# Patient Record
Sex: Female | Born: 1995 | Race: White | Hispanic: No | Marital: Single | Smoking: Never smoker
Health system: Southern US, Community
[De-identification: ages and names within clinical notes are randomized; demographics above are authoritative.]

## PROBLEM LIST (undated history)

## (undated) DIAGNOSIS — F32A Depression, unspecified: Secondary | ICD-10-CM

## (undated) DIAGNOSIS — F329 Major depressive disorder, single episode, unspecified: Secondary | ICD-10-CM

## (undated) DIAGNOSIS — I7300 Raynaud's syndrome without gangrene: Secondary | ICD-10-CM

## (undated) DIAGNOSIS — F988 Other specified behavioral and emotional disorders with onset usually occurring in childhood and adolescence: Secondary | ICD-10-CM

## (undated) HISTORY — DX: Depression, unspecified: F32.A

## (undated) HISTORY — DX: Major depressive disorder, single episode, unspecified: F32.9

## (undated) HISTORY — PX: WISDOM TOOTH EXTRACTION: SHX21

## (undated) HISTORY — DX: Raynaud's syndrome without gangrene: I73.00

## (undated) HISTORY — DX: Other specified behavioral and emotional disorders with onset usually occurring in childhood and adolescence: F98.8

---
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
## 2016-02-11 DIAGNOSIS — N909 Noninflammatory disorder of vulva and perineum, unspecified: Secondary | ICD-10-CM | POA: Diagnosis not present

## 2016-02-11 DIAGNOSIS — N898 Other specified noninflammatory disorders of vagina: Secondary | ICD-10-CM | POA: Diagnosis not present

## 2016-02-26 DIAGNOSIS — J1089 Influenza due to other identified influenza virus with other manifestations: Secondary | ICD-10-CM | POA: Diagnosis not present

## 2016-03-22 DIAGNOSIS — N898 Other specified noninflammatory disorders of vagina: Secondary | ICD-10-CM | POA: Diagnosis not present

## 2016-03-22 DIAGNOSIS — R002 Palpitations: Secondary | ICD-10-CM | POA: Diagnosis not present

## 2016-03-22 DIAGNOSIS — Z1321 Encounter for screening for nutritional disorder: Secondary | ICD-10-CM | POA: Diagnosis not present

## 2016-04-11 ENCOUNTER — Encounter: Payer: Self-pay | Admitting: Family Medicine

## 2016-04-11 ENCOUNTER — Ambulatory Visit (INDEPENDENT_AMBULATORY_CARE_PROVIDER_SITE_OTHER): Payer: 59 | Admitting: Family Medicine

## 2016-04-11 VITALS — BP 92/70 | HR 70 | Temp 98.2°F | Ht 64.0 in | Wt 117.4 lb

## 2016-04-11 DIAGNOSIS — Z0000 Encounter for general adult medical examination without abnormal findings: Secondary | ICD-10-CM

## 2016-04-11 LAB — CBC
HCT: 44.2 % (ref 36.0–46.0)
Hemoglobin: 14.3 g/dL (ref 12.0–15.0)
MCHC: 32.4 g/dL (ref 30.0–36.0)
MCV: 86.3 fl (ref 78.0–100.0)
Platelets: 266 K/uL (ref 150.0–400.0)
RBC: 5.12 Mil/uL — ABNORMAL HIGH (ref 3.87–5.11)
RDW: 14.1 % (ref 11.5–15.5)
WBC: 6.5 K/uL (ref 4.0–10.5)

## 2016-04-11 LAB — LIPID PANEL
Cholesterol: 158 mg/dL (ref 0–200)
HDL: 61.6 mg/dL
LDL Cholesterol: 85 mg/dL (ref 0–99)
NonHDL: 96.36
Total CHOL/HDL Ratio: 3
Triglycerides: 56 mg/dL (ref 0.0–149.0)
VLDL: 11.2 mg/dL (ref 0.0–40.0)

## 2016-04-11 LAB — COMPREHENSIVE METABOLIC PANEL WITH GFR
ALT: 27 U/L (ref 0–35)
AST: 26 U/L (ref 0–37)
Albumin: 4.7 g/dL (ref 3.5–5.2)
Alkaline Phosphatase: 53 U/L (ref 39–117)
BUN: 5 mg/dL — ABNORMAL LOW (ref 6–23)
CO2: 26 meq/L (ref 19–32)
Calcium: 10.2 mg/dL (ref 8.4–10.5)
Chloride: 105 meq/L (ref 96–112)
Creatinine, Ser: 0.6 mg/dL (ref 0.40–1.20)
GFR: 131.84 mL/min
Glucose, Bld: 84 mg/dL (ref 70–99)
Potassium: 4.5 meq/L (ref 3.5–5.1)
Sodium: 138 meq/L (ref 135–145)
Total Bilirubin: 0.9 mg/dL (ref 0.2–1.2)
Total Protein: 7.4 g/dL (ref 6.0–8.3)

## 2016-04-11 LAB — TSH: TSH: 1.22 u[IU]/mL (ref 0.35–4.50)

## 2016-04-11 NOTE — Progress Notes (Signed)
   Subjective:  Patient ID: Crystal Ritter, female    DOB: 11-May-1995  Age: 21 y.o. MRN: [id number]  CC: Physical exam.  HPI Crystal Ritter is a 21 y.o. female presents to the clinic today for a physical exam.  Preventative Healthcare  Pap smear: Up to date.  Immunizations  Tetanus - Up to date. Unsure of date.  Flu - Declines.  Labs: Desires screening labs today.  Exercise: No regular exercise.  Alcohol use: 2 drinks/week.  Smoking/tobacco use: No.  STD/HIV testing: Had testing in Dec.  PMH, Surgical Hx, Family Hx, Social History reviewed and updated as below.  Past Medical History:  Diagnosis Date  . ADD (attention deficit disorder)   . Depression   . Raynaud phenomenon    Past Surgical History:  Procedure Laterality Date  . WISDOM TOOTH EXTRACTION     Family History  Problem Relation Age of Onset  . Cancer Mother     breast cancer  . Hypertension Mother   . Hyperlipidemia Father   . Hypertension Father   . Cancer Maternal Grandmother     breast cancer  . Diabetes Paternal Grandmother    Social History  Substance Use Topics  . Smoking status: Never Smoker  . Smokeless tobacco: Never Used  . Alcohol use Yes    Review of Systems  Musculoskeletal:       Raynauds.  All other systems reviewed and are negative.   Objective:   Today's Vitals: BP 92/70   Pulse 70   Temp 98.2 F (36.8 C) (Oral)   Ht 5\' 4"  (1.626 m)   Wt 117 lb 6.4 oz (53.3 kg)   LMP 03/22/2016   SpO2 99%   BMI 20.15 kg/m   Physical Exam  Constitutional: She is oriented to person, place, and time. She appears well-developed and well-nourished. No distress.  HENT:  Head: Normocephalic and atraumatic.  Nose: Nose normal.  Mouth/Throat: Oropharynx is clear and moist. No oropharyngeal exudate.  Normal TM's bilaterally.   Eyes: Conjunctivae are normal. No scleral icterus.  Neck: Neck supple.  Cardiovascular: Normal rate and regular rhythm.   No murmur heard. Pulmonary/Chest:  Effort normal and breath sounds normal. She has no wheezes. She has no rales.  Abdominal: Soft. She exhibits no distension. There is no tenderness. There is no rebound and no guarding.  Musculoskeletal: Normal range of motion. She exhibits no edema.  Lymphadenopathy:    She has no cervical adenopathy.  Neurological: She is alert and oriented to person, place, and time.  Skin: Skin is warm and dry. No rash noted.  Psychiatric: She has a normal mood and affect.  Vitals reviewed.   Assessment & Plan:   Problem List Items Addressed This Visit    Annual physical exam - Primary    Pap smear up to date. Declines flu vaccine. Tdap likely up to date (prior to entering college). Screening labs today. Had HIV and STD screening in Dec. 2017.      Relevant Orders   CBC   Comprehensive metabolic panel   Lipid panel   TSH      Outpatient Encounter Prescriptions as of 04/11/2016  Medication Sig  . 5-Hydroxytryptophan (5-HTP) 100 MG CAPS Take by mouth.  Marland Kitchen. amphetamine-dextroamphetamine (ADDERALL) 20 MG tablet Take 20 mg by mouth daily.   No facility-administered encounter medications on file as of 04/11/2016.     Follow-up: Annually  Everlene OtherJayce Letishia Elliott DO [facility] [facility]

## 2016-04-11 NOTE — Progress Notes (Signed)
Pre visit review using our clinic review tool, if applicable. No additional management support is needed unless otherwise documented below in the visit note. 

## 2016-04-11 NOTE — Patient Instructions (Signed)
Try arginine.  Take care  Dr. Lacinda Axon   Health Maintenance, Female Adopting a healthy lifestyle and getting preventive care can go a long way to promote health and wellness. Talk with your health care provider about what schedule of regular examinations is right for you. This is a good chance for you to check in with your provider about disease prevention and staying healthy. In between checkups, there are plenty of things you can do on your own. Experts have done a lot of research about which lifestyle changes and preventive measures are most likely to keep you healthy. Ask your health care provider for more information. Weight and diet Eat a healthy diet  Be sure to include plenty of vegetables, fruits, low-fat dairy products, and lean protein.  Do not eat a lot of foods high in solid fats, added sugars, or salt.  Get regular exercise. This is one of the most important things you can do for your health.  Most adults should exercise for at least 150 minutes each week. The exercise should increase your heart rate and make you sweat (moderate-intensity exercise).  Most adults should also do strengthening exercises at least twice a week. This is in addition to the moderate-intensity exercise. Maintain a healthy weight  Body mass index (BMI) is a measurement that can be used to identify possible weight problems. It estimates body fat based on height and weight. Your health care provider can help determine your BMI and help you achieve or maintain a healthy weight.  For females 23 years of age and older:  A BMI below 18.5 is considered underweight.  A BMI of 18.5 to 24.9 is normal.  A BMI of 25 to 29.9 is considered overweight.  A BMI of 30 and above is considered obese. Watch levels of cholesterol and blood lipids  You should start having your blood tested for lipids and cholesterol at 23 years of age, then have this test every 5 years.  You may need to have your cholesterol levels  checked more often if:  Your lipid or cholesterol levels are high.  You are older than 23 years of age.  You are at high risk for heart disease. Cancer screening Lung Cancer  Lung cancer screening is recommended for adults 29-40 years old who are at high risk for lung cancer because of a history of smoking.  A yearly low-dose CT scan of the lungs is recommended for people who:  Currently smoke.  Have quit within the past 15 years.  Have at least a 30-pack-year history of smoking. A pack year is smoking an average of one pack of cigarettes a day for 1 year.  Yearly screening should continue until it has been 15 years since you quit.  Yearly screening should stop if you develop a health problem that would prevent you from having lung cancer treatment. Breast Cancer  Practice breast self-awareness. This means understanding how your breasts normally appear and feel.  It also means doing regular breast self-exams. Let your health care provider know about any changes, no matter how small.  If you are in your 20s or 30s, you should have a clinical breast exam (CBE) by a health care provider every 1-3 years as part of a regular health exam.  If you are 70 or older, have a CBE every year. Also consider having a breast X-ray (mammogram) every year.  If you have a family history of breast cancer, talk to your health care provider about genetic screening.  If you are at high risk for breast cancer, talk to your health care provider about having an MRI and a mammogram every year.  Breast cancer gene (BRCA) assessment is recommended for women who have family members with BRCA-related cancers. BRCA-related cancers include:  Breast.  Ovarian.  Tubal.  Peritoneal cancers.  Results of the assessment will determine the need for genetic counseling and BRCA1 and BRCA2 testing. Cervical Cancer  Your health care provider may recommend that you be screened regularly for cancer of the pelvic  organs (ovaries, uterus, and vagina). This screening involves a pelvic examination, including checking for microscopic changes to the surface of your cervix (Pap test). You may be encouraged to have this screening done every 3 years, beginning at age 65.  For women ages 23-65, health care providers may recommend pelvic exams and Pap testing every 3 years, or they may recommend the Pap and pelvic exam, combined with testing for human papilloma virus (HPV), every 5 years. Some types of HPV increase your risk of cervical cancer. Testing for HPV may also be done on women of any age with unclear Pap test results.  Other health care providers may not recommend any screening for nonpregnant women who are considered low risk for pelvic cancer and who do not have symptoms. Ask your health care provider if a screening pelvic exam is right for you.  If you have had past treatment for cervical cancer or a condition that could lead to cancer, you need Pap tests and screening for cancer for at least 20 years after your treatment. If Pap tests have been discontinued, your risk factors (such as having a new sexual partner) need to be reassessed to determine if screening should resume. Some women have medical problems that increase the chance of getting cervical cancer. In these cases, your health care provider may recommend more frequent screening and Pap tests. Colorectal Cancer  This type of cancer can be detected and often prevented.  Routine colorectal cancer screening usually begins at 23 years of age and continues through 23 years of age.  Your health care provider may recommend screening at an earlier age if you have risk factors for colon cancer.  Your health care provider may also recommend using home test kits to check for hidden blood in the stool.  A small camera at the end of a tube can be used to examine your colon directly (sigmoidoscopy or colonoscopy). This is done to check for the earliest forms  of colorectal cancer.  Routine screening usually begins at age 43.  Direct examination of the colon should be repeated every 5-10 years through 23 years of age. However, you may need to be screened more often if early forms of precancerous polyps or small growths are found. Skin Cancer  Check your skin from head to toe regularly.  Tell your health care provider about any new moles or changes in moles, especially if there is a change in a mole's shape or color.  Also tell your health care provider if you have a mole that is larger than the size of a pencil eraser.  Always use sunscreen. Apply sunscreen liberally and repeatedly throughout the day.  Protect yourself by wearing long sleeves, pants, a wide-brimmed hat, and sunglasses whenever you are outside. Heart disease, diabetes, and high blood pressure  High blood pressure causes heart disease and increases the risk of stroke. High blood pressure is more likely to develop in:  People who have blood pressure in the  high end of the normal range (130-139/85-89 mm Hg).  People who are overweight or obese.  People who are African American.  If you are 29-58 years of age, have your blood pressure checked every 3-5 years. If you are 1 years of age or older, have your blood pressure checked every year. You should have your blood pressure measured twice-once when you are at a hospital or clinic, and once when you are not at a hospital or clinic. Record the average of the two measurements. To check your blood pressure when you are not at a hospital or clinic, you can use:  An automated blood pressure machine at a pharmacy.  A home blood pressure monitor.  If you are between 46 years and 51 years old, ask your health care provider if you should take aspirin to prevent strokes.  Have regular diabetes screenings. This involves taking a blood sample to check your fasting blood sugar level.  If you are at a normal weight and have a low risk for  diabetes, have this test once every three years after 23 years of age.  If you are overweight and have a high risk for diabetes, consider being tested at a younger age or more often. Preventing infection Hepatitis B  If you have a higher risk for hepatitis B, you should be screened for this virus. You are considered at high risk for hepatitis B if:  You were born in a country where hepatitis B is common. Ask your health care provider which countries are considered high risk.  Your parents were born in a high-risk country, and you have not been immunized against hepatitis B (hepatitis B vaccine).  You have HIV or AIDS.  You use needles to inject street drugs.  You live with someone who has hepatitis B.  You have had sex with someone who has hepatitis B.  You get hemodialysis treatment.  You take certain medicines for conditions, including cancer, organ transplantation, and autoimmune conditions. Hepatitis C  Blood testing is recommended for:  Everyone born from [birth year] through 1967.  Anyone with known risk factors for hepatitis C. Sexually transmitted infections (STIs)  You should be screened for sexually transmitted infections (STIs) including gonorrhea and chlamydia if:  You are sexually active and are younger than 22 years of age.  You are older than 23 years of age and your health care provider tells you that you are at risk for this type of infection.  Your sexual activity has changed since you were last screened and you are at an increased risk for chlamydia or gonorrhea. Ask your health care provider if you are at risk.  If you do not have HIV, but are at risk, it may be recommended that you take a prescription medicine daily to prevent HIV infection. This is called pre-exposure prophylaxis (PrEP). You are considered at risk if:  You are sexually active and do not regularly use condoms or know the HIV status of your partner(s).  You take drugs by injection.  You are  sexually active with a partner who has HIV. Talk with your health care provider about whether you are at high risk of being infected with HIV. If you choose to begin PrEP, you should first be tested for HIV. You should then be tested every 3 months for as long as you are taking PrEP. Pregnancy  If you are premenopausal and you may become pregnant, ask your health care provider about preconception counseling.  If you may become  pregnant, take 400 to 800 micrograms (mcg) of folic acid every day.  If you want to prevent pregnancy, talk to your health care provider about birth control (contraception). Osteoporosis and menopause  Osteoporosis is a disease in which the bones lose minerals and strength with aging. This can result in serious bone fractures. Your risk for osteoporosis can be identified using a bone density scan.  If you are 27 years of age or older, or if you are at risk for osteoporosis and fractures, ask your health care provider if you should be screened.  Ask your health care provider whether you should take a calcium or vitamin D supplement to lower your risk for osteoporosis.  Menopause may have certain physical symptoms and risks.  Hormone replacement therapy may reduce some of these symptoms and risks. Talk to your health care provider about whether hormone replacement therapy is right for you. Follow these instructions at home:  Schedule regular health, dental, and eye exams.  Stay current with your immunizations.  Do not use any tobacco products including cigarettes, chewing tobacco, or electronic cigarettes.  If you are pregnant, do not drink alcohol.  If you are breastfeeding, limit how much and how often you drink alcohol.  Limit alcohol intake to no more than 1 drink per day for nonpregnant women. One drink equals 12 ounces of beer, 5 ounces of wine, or 1 ounces of hard liquor.  Do not use street drugs.  Do not share needles.  Ask your health care  provider for help if you need support or information about quitting drugs.  Tell your health care provider if you often feel depressed.  Tell your health care provider if you have ever been abused or do not feel safe at home. This information is not intended to replace advice given to you by your health care provider. Make sure you discuss any questions you have with your health care provider. Document Released: 07/25/2010 Document Revised: 06/17/2015 Document Reviewed: 10/13/2014 Elsevier Interactive Patient Education  2017 [place] Raynaud phenomenon is a condition that affects the blood vessels (arteries) that carry blood to your fingers and toes. The arteries that supply blood to your ears or the tip of your nose might also be affected. Raynaud phenomenon causes the arteries to temporarily narrow. As a result, the flow of blood to the affected areas is temporarily decreased. This usually occurs in response to cold temperatures or stress. During an attack, the skin in the affected areas turns white. You may also feel tingling or numbness in those areas. Attacks usually last for only a brief period, and then the blood flow to the area returns to normal. In most cases, Raynaud phenomenon does not cause serious health problems. What are the causes? For many people with this condition, the cause is not known. Raynaud phenomenon is sometimes associated with other diseases, such as scleroderma or lupus. What increases the risk? Raynaud phenomenon can affect anyone, but it develops most often in people who are 31-9 years old. It affects more females than males. What are the signs or symptoms? Symptoms of Raynaud phenomenon may occur when you are exposed to cold temperatures or when you have emotional stress. The symptoms may last for a few minutes or up to several hours. They usually affect your fingers but may also affect your toes, ears, or the tip of your nose. Symptoms may  include:  Changes in skin color. The skin in the affected areas will turn pale or  white. The skin may then change from white to bluish to red as normal blood flow returns to the area.  Numbness, tingling, or pain in the affected areas. In severe cases, sores may develop in the affected areas. How is this diagnosed? Your health care provider will do a physical exam and take your medical history. You may be asked to put your hands in cold water to check for a reaction to cold temperature. Blood tests may be done to check for other diseases or conditions. Your health care provider may also order a test to check the movement of blood through your arteries and veins (vascular ultrasound). How is this treated? Treatment often involves making lifestyle changes and taking steps to control your exposure to cold temperatures. For more severe cases, medicine (calcium channel blockers) may be used to improve blood flow. Surgery is sometimes done to block the nerves that control the affected arteries, but this is rare. Follow these instructions at home:  Avoid exposure to cold by taking these steps:  If possible, stay indoors during cold weather.  When you go outside during cold weather, dress in layers and wear mittens, a hat, a scarf, and warm footwear.  Wear mittens or gloves when handling ice or frozen food.  Use holders for glasses or cans containing cold drinks.  Let warm water run for a while before taking a shower or bath.  Warm up the car before driving in cold weather.  If possible, avoid stressful and emotional situations. Exercise, meditation, and yoga may help you cope with stress. Biofeedback may be useful.  Do not use any tobacco products, including cigarettes, chewing tobacco, or electronic cigarettes. If you need help quitting, ask your health care provider.  Avoid secondhand smoke.  Limit your use of caffeine. Switch to decaffeinated coffee, tea, and soda. Avoid  chocolate.  Wear loose fitting socks and comfortable, roomy shoes.  Avoid vibrating tools and machinery.  Take medicines only as directed by your health care provider. Contact a health care provider if:  Your discomfort becomes worse despite lifestyle changes.  You develop sores on your fingers or toes that do not heal.  Your fingers or toes turn black.  You have breaks in the skin on your fingers or toes.  You have a fever.  You have pain or swelling in your joints.  You have a rash.  Your symptoms occur on only one side of your body. This information is not intended to replace advice given to you by your health care provider. Make sure you discuss any questions you have with your health care provider. Document Released: 01/07/2000 Document Revised: 06/17/2015 Document Reviewed: 07/14/2015 Elsevier Interactive Patient Education  2017 [employer].

## 2016-04-11 NOTE — Assessment & Plan Note (Signed)
Pap smear up to date. Declines flu vaccine. Tdap likely up to date (prior to entering college). Screening labs today. Had HIV and STD screening in Dec. 2017.

## 2016-04-12 DIAGNOSIS — F4323 Adjustment disorder with mixed anxiety and depressed mood: Secondary | ICD-10-CM | POA: Diagnosis not present

## 2016-05-25 DIAGNOSIS — R234 Changes in skin texture: Secondary | ICD-10-CM | POA: Diagnosis not present

## 2016-06-01 DIAGNOSIS — R234 Changes in skin texture: Secondary | ICD-10-CM | POA: Diagnosis not present

## 2016-06-27 DIAGNOSIS — R490 Dysphonia: Secondary | ICD-10-CM | POA: Diagnosis not present

## 2016-06-27 DIAGNOSIS — H903 Sensorineural hearing loss, bilateral: Secondary | ICD-10-CM | POA: Diagnosis not present

## 2016-06-29 DIAGNOSIS — H903 Sensorineural hearing loss, bilateral: Secondary | ICD-10-CM | POA: Diagnosis not present

## 2016-08-17 DIAGNOSIS — Z113 Encounter for screening for infections with a predominantly sexual mode of transmission: Secondary | ICD-10-CM | POA: Diagnosis not present

## 2016-08-17 DIAGNOSIS — Z01419 Encounter for gynecological examination (general) (routine) without abnormal findings: Secondary | ICD-10-CM | POA: Diagnosis not present

## 2016-08-17 DIAGNOSIS — Z124 Encounter for screening for malignant neoplasm of cervix: Secondary | ICD-10-CM | POA: Diagnosis not present
